# Patient Record
Sex: Male | Born: 1990 | Hispanic: No | Marital: Single | Smoking: Never smoker
Health system: Southern US, Community
[De-identification: ages and names within clinical notes are randomized; demographics above are authoritative.]

---

## 2015-06-07 ENCOUNTER — Encounter (HOSPITAL_COMMUNITY): Payer: Self-pay | Admitting: Emergency Medicine

## 2015-06-07 ENCOUNTER — Emergency Department (HOSPITAL_COMMUNITY)
Admission: EM | Admit: 2015-06-07 | Discharge: 2015-06-07 | Disposition: A | Discharge status: Home / Self Care | Payer: Self-pay | Attending: Emergency Medicine | Admitting: Emergency Medicine

## 2015-06-07 DIAGNOSIS — B349 Viral infection, unspecified: Secondary | ICD-10-CM | POA: Insufficient documentation

## 2015-06-07 NOTE — ED Notes (Signed)
Pt c/o body aches and headache onset yesterday afternoon. Pt denies nausea or vomiting. Pt denies exposure to others with illness.

## 2015-06-07 NOTE — ED Provider Notes (Signed)
CSN: [id number]     Arrival date & time 06/07/15  4098 History   First MD Initiated Contact with Patient 06/07/15 1005     Chief Complaint  Patient presents with  . Influenza  . Generalized Body Aches  . Headache   HPI   25 year old male presents today with complaints of generalized body aches, fever, sore throat. Patient reports symptoms started yesterday with diffuse body aches, dry nonproductive cough, and headache. He describes the headache as generalized with no focal neurological deficits, he denies neck stiffness, durations mental status. Patient denies any nausea, vomiting, chest pain, shortness of breath, abdominal pain, diarrhea, changes in urine color clarity or characteristics. Patient reports he is otherwise healthy, takes no daily medications, has not tried any medications prior to arrival. Has no headache right flank.   History reviewed. No pertinent past medical history. History reviewed. No pertinent past surgical history. No family history on file. Social History  Substance Use Topics  . Smoking status: Never Smoker   . Smokeless tobacco: None  . Alcohol Use: Yes    Review of Systems  All other systems reviewed and are negative.   Allergies  Review of patient's allergies indicates no known allergies.  Home Medications   Prior to Admission medications   Not on File   BP 130/70 mmHg  Pulse 67  Temp(Src) 98.7 F (37.1 C) (Oral)  Resp 18  Ht  (1.575 m)  Wt 53.269 kg  BMI 21.47 kg/m2  SpO2 98%   Physical Exam  Constitutional: He is oriented to person, place, and time. He appears well-developed and well-nourished.  HENT:  Head: Normocephalic and atraumatic.  Right Ear: Hearing and tympanic membrane normal.  Left Ear: Hearing and tympanic membrane normal.  Nose: Nose normal. No rhinorrhea.  Mouth/Throat: Oropharynx is clear and moist and mucous membranes are normal. No oropharyngeal exudate, posterior oropharyngeal edema, posterior oropharyngeal  erythema or tonsillar abscesses.  Eyes: Conjunctivae are normal. Pupils are equal, round, and reactive to light. Right eye exhibits no discharge. Left eye exhibits no discharge. No scleral icterus.  Neck: Normal range of motion. No JVD present. No tracheal deviation present.  Cardiovascular: Normal rate, regular rhythm, normal heart sounds and intact distal pulses.  Exam reveals no gallop and no friction rub.   No murmur heard. Pulmonary/Chest: Effort normal and breath sounds normal. No stridor. No respiratory distress. He has no wheezes. He has no rales. He exhibits no tenderness.  Musculoskeletal: Normal range of motion. He exhibits no edema.  Neurological: He is alert and oriented to person, place, and time. Coordination normal.  Skin: Skin is warm and dry. No rash noted. No erythema. No pallor.  Psychiatric: He has a normal mood and affect. His behavior is normal. Judgment and thought content normal.  Nursing note and vitals reviewed.   ED Course  Procedures (including critical care time) Labs Review Labs Reviewed - No data to display  Imaging Review No results found. I have personally reviewed and evaluated these images and lab results as part of my medical decision-making.   EKG Interpretation None      MDM   Final diagnoses:  Viral illness    Labs:  Imaging:  Consults:  Therapeutics:  Discharge Meds:   Assessment/Plan: 25 year old male presents with likely viral illness. This could most likely represent influenza, acute onset with extreme fatigue, sore throat, dry nonproductive cough. Patient is presently afebrile, nontoxic in no acute distress. He has clear lung sounds, oxygen and respiratory rate reassuring.  Patient is otherwise healthy male with no acute chronic medical conditions. He'll be discharged home with symptomatic care instructions, strict return precautions. Patient verbalized understanding and agreement for today's plan and had no further questions or  concerns at the time of discharge         Eyvonne Mechanic, PA-C 06/07/15 1213  Lyndal Pulley, MD 06/07/15 [phone]

## 2015-09-06 ENCOUNTER — Emergency Department (HOSPITAL_COMMUNITY)
Admission: EM | Admit: 2015-09-06 | Discharge: 2015-09-06 | Disposition: A | Discharge status: Home / Self Care | Payer: Self-pay | Attending: Emergency Medicine | Admitting: Emergency Medicine

## 2015-09-06 ENCOUNTER — Emergency Department (HOSPITAL_COMMUNITY): Payer: Self-pay

## 2015-09-06 ENCOUNTER — Encounter (HOSPITAL_COMMUNITY): Payer: Self-pay | Admitting: *Deleted

## 2015-09-06 DIAGNOSIS — S40811A Abrasion of right upper arm, initial encounter: Secondary | ICD-10-CM | POA: Insufficient documentation

## 2015-09-06 DIAGNOSIS — R0781 Pleurodynia: Secondary | ICD-10-CM

## 2015-09-06 DIAGNOSIS — Y9289 Other specified places as the place of occurrence of the external cause: Secondary | ICD-10-CM | POA: Insufficient documentation

## 2015-09-06 DIAGNOSIS — W010XXA Fall on same level from slipping, tripping and stumbling without subsequent striking against object, initial encounter: Secondary | ICD-10-CM | POA: Insufficient documentation

## 2015-09-06 DIAGNOSIS — Y9389 Activity, other specified: Secondary | ICD-10-CM | POA: Insufficient documentation

## 2015-09-06 DIAGNOSIS — Y998 Other external cause status: Secondary | ICD-10-CM | POA: Insufficient documentation

## 2015-09-06 MED ORDER — OXYCODONE HCL 5 MG PO TABS: 5.0000 mg | ORAL_TABLET | Freq: Four times a day (QID) | ORAL | Status: AC | PRN

## 2015-09-06 MED ORDER — OXYCODONE-ACETAMINOPHEN 5-325 MG PO TABS
1.0000 | ORAL_TABLET | Freq: Once | ORAL | Status: AC
  Administered 2015-09-06: 1 via ORAL
  Filled 2015-09-06: qty 1

## 2015-09-06 NOTE — ED Provider Notes (Signed)
CSN: [id number]     Arrival date & time 09/06/15  4098 History   First MD Initiated Contact with Patient 09/06/15 0703     Chief Complaint  Patient presents with  . Back Injury   (Consider location/radiation/quality/duration/timing/severity/associated sxs/prior Treatment) HPI 25 y.o. male  presents to the Emergency Department today complaining of right back pain s/p fall on Saturday. Pt states that he was lifting a trash can to put into a dumpster when he slipped and fell on his right side. Noted pain immediately. States pain is 10/10 and hurts with inspiration. Has not tried any OTC relief. No fevers. No CP/SOB/ABD pain. No headaches. No N/V. No fevers. No other symptoms noted.   History reviewed. No pertinent past medical history. History reviewed. No pertinent past surgical history. No family history on file. Social History  Substance Use Topics  . Smoking status: Never Smoker   . Smokeless tobacco: Never Used  . Alcohol Use: Yes    Review of Systems  Constitutional: Negative for fever.  Respiratory: Negative for shortness of breath.   Cardiovascular: Negative for chest pain.  Musculoskeletal: Negative for myalgias, back pain, neck pain and neck stiffness.   Allergies  Review of patient's allergies indicates no known allergies.  Home Medications   Prior to Admission medications   Not on File   BP 120/65 mmHg  Pulse 67  Temp(Src) 98.4 F (36.9 C) (Oral)  Resp 18  Wt 52.164 kg  SpO2 100%   Physical Exam  Constitutional: He is oriented to person, place, and time. He appears well-developed and well-nourished.  HENT:  Head: Normocephalic and atraumatic.  Eyes: EOM are normal. Pupils are equal, round, and reactive to light.  Neck: Normal range of motion. Neck supple.  Cardiovascular: Normal rate, regular rhythm, normal heart sounds and intact distal pulses.   No murmur heard. Pulmonary/Chest: Effort normal and breath sounds normal. No accessory muscle usage. No  respiratory distress. He has no decreased breath sounds. He has no wheezes. He has no rhonchi. He has no rales. He exhibits tenderness.  Abrasions noted on right axilla. No step off or deformities visualized or palpated. No ecchymosis.   Abdominal: Soft.  Musculoskeletal: Normal range of motion.  Neurological: He is alert and oriented to person, place, and time.  Skin: Skin is warm and dry.  Psychiatric: He has a normal mood and affect. His behavior is normal. Thought content normal.  Nursing note and vitals reviewed.  ED Course  Procedures (including critical care time) Labs Review Labs Reviewed - No data to display  Imaging Review Dg Chest 2 View  09/06/2015  CLINICAL DATA:  Right-sided chest pain from recent fall, initial encounter EXAM: CHEST  2 VIEW COMPARISON:  None. FINDINGS: The heart size and mediastinal contours are within normal limits. Both lungs are clear. The visualized skeletal structures are unremarkable. IMPRESSION: No active cardiopulmonary disease. Electronically Signed   By: Alcide Clever M.D.   On: 09/06/2015 08:23   I have personally reviewed and evaluated these images and lab results as part of my medical decision-making.   EKG Interpretation None      MDM  I have reviewed and evaluated the relevant imaging studies.  I have reviewed the relevant previous healthcare records. I obtained HPI from historian.  ED Course:  Assessment: Pt is a 24yM  who presents with right axilla pain s/p fall while lifting trash can. Fell with no head trauma or LOC. On exam, pt in NAD. Nontoxic/nonseptic appearing. VSS. Afebrile. Lungs  CTA. Heart RRR. Abdomen nontender soft. Abrasions noted on right axilla with no visible/palpable deformities or step offs. CXR negative for fracture. Given analgesia in ED. Plan is to DC Home with follow up to PCP/ Given Incentive Spirometer. At time of discharge, Patient is in no acute distress. Vital Signs are stable. Patient is able to ambulate. Patient  able to tolerate PO.    Disposition/Plan:  DC Home Additional Verbal discharge instructions given and discussed with patient.  Pt Instructed to f/u with PCP in the next week for evaluation and treatment of symptoms. Return precautions given Pt acknowledges and agrees with plan  Supervising Physician Leta BaptistEmily Roe Nguyen, MD   Final diagnoses:  Rib pain on right side     Audry Piliyler Adrieanna Boteler, PA-C 09/06/15 [id number]  Leta BaptistEmily Roe Nguyen, MD 09/11/15 [phone]

## 2015-09-06 NOTE — ED Notes (Signed)
Saturday he feel landing on the bottom of a trash can.  C/o right lower back pain
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
## 2018-06-08 ENCOUNTER — Other Ambulatory Visit: Payer: Self-pay

## 2018-06-08 ENCOUNTER — Emergency Department (HOSPITAL_COMMUNITY)
Admission: EM | Admit: 2018-06-08 | Discharge: 2018-06-08 | Disposition: A | Discharge status: Home / Self Care | Payer: No Typology Code available for payment source | Attending: Emergency Medicine | Admitting: Emergency Medicine

## 2018-06-08 ENCOUNTER — Encounter (HOSPITAL_COMMUNITY): Payer: Self-pay | Admitting: Emergency Medicine

## 2018-06-08 ENCOUNTER — Emergency Department (HOSPITAL_COMMUNITY): Payer: No Typology Code available for payment source

## 2018-06-08 DIAGNOSIS — Y939 Activity, unspecified: Secondary | ICD-10-CM | POA: Insufficient documentation

## 2018-06-08 DIAGNOSIS — M791 Myalgia, unspecified site: Secondary | ICD-10-CM | POA: Insufficient documentation

## 2018-06-08 DIAGNOSIS — M7918 Myalgia, other site: Secondary | ICD-10-CM

## 2018-06-08 DIAGNOSIS — Y998 Other external cause status: Secondary | ICD-10-CM | POA: Diagnosis not present

## 2018-06-08 DIAGNOSIS — R079 Chest pain, unspecified: Secondary | ICD-10-CM | POA: Diagnosis present

## 2018-06-08 DIAGNOSIS — Y92410 Unspecified street and highway as the place of occurrence of the external cause: Secondary | ICD-10-CM | POA: Diagnosis not present

## 2018-06-08 MED ORDER — NAPROXEN 250 MG PO TABS
500.0000 mg | ORAL_TABLET | Freq: Once | ORAL | Status: AC
  Administered 2018-06-08: 500 mg via ORAL
  Filled 2018-06-08: qty 2

## 2018-06-08 MED ORDER — NAPROXEN 500 MG PO TABS: 500.0000 mg | ORAL_TABLET | Freq: Two times a day (BID) | ORAL | 0 refills | Status: AC | PRN

## 2018-06-08 MED ORDER — METHOCARBAMOL 500 MG PO TABS: 500.0000 mg | ORAL_TABLET | Freq: Two times a day (BID) | ORAL | 0 refills | Status: AC | PRN

## 2018-06-08 NOTE — ED Notes (Signed)
No answer for vitals recheck x1 
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
## 2018-06-08 NOTE — Discharge Instructions (Signed)
Alternate ice and heat to areas of injury 3-4 times per day to limit inflammation and spasm.  Avoid strenuous activity and heavy lifting.  We recommend consistent use of naproxen in addition to Robaxin for muscle spasms.  Do not drive or drink alcohol after taking Robaxin as it may make you drowsy and impair your judgment.  We recommend follow-up with a primary care doctor to ensure resolution of symptoms.  Return to the ED for any new or concerning symptoms.  Alternar el hielo y el calor a las reas de lesin 3-4 veces al da para limitar la inflamacin y el espasmo.  Evite la actividad extenuante y el levantamiento pesado.  Recomendamos el uso constante de naproxeno adems de Robaxin para espasmos musculares.  No conduzca ni beba alcohol despus de tomar Robaxin, ya que puede causarle somnolencia y Audiological scientist su juicio.  Recomendamos el seguimiento con un mdico de atencin primaria para asegurar la resolucin de los sntomas.  Regrese al ED para cualquier sntoma nuevo o preocupante.
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
## 2023-01-19 ENCOUNTER — Encounter (HOSPITAL_BASED_OUTPATIENT_CLINIC_OR_DEPARTMENT_OTHER): Payer: Self-pay | Admitting: Emergency Medicine

## 2023-01-19 ENCOUNTER — Emergency Department (HOSPITAL_BASED_OUTPATIENT_CLINIC_OR_DEPARTMENT_OTHER)
Admission: EM | Admit: 2023-01-19 | Discharge: 2023-01-19 | Disposition: A | Discharge status: Home / Self Care | Payer: Self-pay | Attending: Emergency Medicine | Admitting: Emergency Medicine

## 2023-01-19 ENCOUNTER — Other Ambulatory Visit: Payer: Self-pay

## 2023-01-19 DIAGNOSIS — Y9389 Activity, other specified: Secondary | ICD-10-CM | POA: Insufficient documentation

## 2023-01-19 DIAGNOSIS — W458XXA Other foreign body or object entering through skin, initial encounter: Secondary | ICD-10-CM | POA: Insufficient documentation

## 2023-01-19 DIAGNOSIS — S0502XA Injury of conjunctiva and corneal abrasion without foreign body, left eye, initial encounter: Secondary | ICD-10-CM | POA: Insufficient documentation

## 2023-01-19 MED ORDER — CIPROFLOXACIN HCL 0.3 % OP SOLN: 2.0000 [drp] | Freq: Four times a day (QID) | OPHTHALMIC | 0 refills | Status: AC

## 2023-01-19 MED ORDER — FLUORESCEIN SODIUM 1 MG OP STRP
1.0000 | ORAL_STRIP | Freq: Once | OPHTHALMIC | Status: DC
  Filled 2023-01-19: qty 1

## 2023-01-19 MED ORDER — TETRACAINE HCL 0.5 % OP SOLN
2.0000 [drp] | Freq: Once | OPHTHALMIC | Status: DC
  Filled 2023-01-19: qty 4

## 2023-01-19 NOTE — Discharge Instructions (Signed)
Gracias por permitirnos ser parte de su cuidado hoy.  Tienes una abrasin en la crnea.  Por favor control con el oftalmlogo el lunes o martes de la prxima [place].  Utilice las gotas para los ojos segn lo prescrito.  Regrese al servicio de urgencias si sus sntomas empeoran repentinamente o si tiene alguna inquietud nueva.  Thank you for allowing [place] to be a part of your care today.  You have an abrasion to your cornea.  Please follow-up with the ophthalmologist on Monday or Tuesday of next week.  Use the eyedrops as prescribed.  Return to the ED if develop sudden worsening of her symptoms or if you have any new concerns.

## 2023-01-19 NOTE — ED Triage Notes (Signed)
Eye red and painful\ Was cutting deck for work and thinks a piece of deck went in left eye. Flush with water PTA with out relieve Happened today around 3pm

## 2023-01-19 NOTE — ED Provider Notes (Signed)
[place] EMERGENCY DEPARTMENT AT [facility] Provider Note   CSN: [id number] Arrival date & time: 01/19/23  1850     History  Chief Complaint  Patient presents with   Eye Problem    Peter Harper is a 32 y.o. male presents to the ED complaining of redness and pain to his left eye.  Patient was cutting a deck for work and believes a piece of the vinyl plank went into his eye.  Patient has been flushing the eye with water without relief.  He feels that there is still a foreign body in the eye.  Incident happened around 3 PM today.  He also feels that the eye has become more swollen and his vision slightly blurred while waiting for evaluation.         Home Medications Prior to Admission medications   Medication Sig Start Date End Date Taking? Authorizing Provider  methocarbamol (ROBAXIN) 500 MG tablet Take 1 tablet (500 mg total) by mouth every 12 (twelve) hours as needed for muscle spasms. 06/08/18   Antony Madura, PA-C  naproxen (NAPROSYN) 500 MG tablet Take 1 tablet (500 mg total) by mouth every 12 (twelve) hours as needed for mild pain or moderate pain. 06/08/18   Antony Madura, PA-C  oxyCODONE (ROXICODONE) 5 MG immediate release tablet Take 1 tablet (5 mg total) by mouth every 6 (six) hours as needed for severe pain. 09/06/15   Audry Pili, PA-C      Allergies    Patient has no known allergies.    Review of Systems   Review of Systems  Eyes:  Positive for pain, redness and visual disturbance (blurry). Negative for photophobia.    Physical Exam Updated Vital Signs BP 124/81 (BP Location: Right Arm)   Pulse 67   Temp 97.6 F (36.4 C)   Resp 17   SpO2 99%  Physical Exam Vitals and nursing note reviewed.  Constitutional:      General: He is awake. He is not in acute distress.    Appearance: Normal appearance. He is not ill-appearing or diaphoretic.  Eyes:     General: Lids are normal. Lids are everted, no foreign bodies appreciated. Vision grossly intact.         Left eye: No foreign body or discharge.     Intraocular pressure: Left eye pressure is 20 mmHg. Measurements were taken using a handheld tonometer.    Extraocular Movements: Extraocular movements intact.     Left eye: Normal extraocular motion.     Conjunctiva/sclera:     Left eye: Left conjunctiva is injected. No hemorrhage.    Pupils: Pupils are equal, round, and reactive to light.     Left eye: Corneal abrasion and fluorescein uptake present.   Cardiovascular:     Rate and Rhythm: Normal rate and regular rhythm.  Pulmonary:     Effort: Pulmonary effort is normal.  Neurological:     Mental Status: He is alert. Mental status is at baseline.  Psychiatric:        Mood and Affect: Mood normal.        Behavior: Behavior normal.     ED Results / Procedures / Treatments   Labs (all labs ordered are listed, but only abnormal results are displayed) Labs Reviewed - No data to display  EKG None  Radiology No results found.  Procedures Procedures    Medications Ordered in ED Medications  tetracaine (PONTOCAINE) 0.5 % ophthalmic solution 2 drop (has no administration in time range)  fluorescein ophthalmic strip 1 strip (has no administration in time range)    ED Course/ Medical Decision Making/ A&P                                 Medical Decision Making Risk Prescription drug management.   This patient presents to the ED with chief complaint(s) of eye irritation and injury. The complaint involves an extensive differential diagnosis and also carries with it a high risk of complications and morbidity.    The differential diagnosis includes corneal abrasion, foreign body in eye   Initial Assessment:   Exam significant for injected conjunctive/sclerae of left eye, no hemorrhage.  EOM intact.  Intraocular pressure normal.  No obvious foreign body.  Eye was flushed with normal saline.  Vision is grossly intact.  Visual acuity was checked.  Family reports patient has poor  vision and does not wear contacts or glasses.  Woods lamp exam revealed corneal abrasions.    Disposition:   Will treat with ciprofloxacin eye drops.  Advised patient to follow-up with ophthalmology early next week. Referral information provided.  The patient has been appropriately medically screened and/or stabilized in the ED. I have low suspicion for any other emergent medical condition which would require further screening, evaluation or treatment in the ED or require inpatient management. At time of discharge the patient is hemodynamically stable and in no acute distress. I have discussed work-up results and diagnosis with patient and answered all questions. Patient is agreeable with discharge plan. We discussed strict return precautions for returning to the emergency department and they verbalized understanding.           Final Clinical Impression(s) / ED Diagnoses Final diagnoses:  None    Rx / DC Orders ED Discharge Orders     None         Lenard Simmer, PA-C 01/19/23 2316    Terrilee Files, MD 01/20/23 1050

## 2023-01-19 NOTE — ED Notes (Signed)
Family member states pt has history of bad vision. Does not wear glasses or contacts.

## 2023-10-01 ENCOUNTER — Encounter (HOSPITAL_BASED_OUTPATIENT_CLINIC_OR_DEPARTMENT_OTHER): Payer: Self-pay | Admitting: Emergency Medicine

## 2024-03-31 ENCOUNTER — Encounter (HOSPITAL_BASED_OUTPATIENT_CLINIC_OR_DEPARTMENT_OTHER): Payer: Self-pay | Admitting: Emergency Medicine

## 2024-03-31 ENCOUNTER — Emergency Department (HOSPITAL_BASED_OUTPATIENT_CLINIC_OR_DEPARTMENT_OTHER): Payer: Self-pay | Admitting: Radiology

## 2024-03-31 ENCOUNTER — Other Ambulatory Visit: Payer: Self-pay

## 2024-03-31 DIAGNOSIS — R0789 Other chest pain: Secondary | ICD-10-CM | POA: Insufficient documentation

## 2024-03-31 DIAGNOSIS — R059 Cough, unspecified: Secondary | ICD-10-CM | POA: Insufficient documentation

## 2024-03-31 DIAGNOSIS — M549 Dorsalgia, unspecified: Secondary | ICD-10-CM | POA: Insufficient documentation

## 2024-03-31 LAB — CBC
HCT: 44.6 % (ref 39.0–52.0)
Hemoglobin: 15.6 g/dL (ref 13.0–17.0)
MCH: 31.7 pg (ref 26.0–34.0)
MCHC: 35 g/dL (ref 30.0–36.0)
MCV: 90.7 fL (ref 80.0–100.0)
Platelets: 257 K/uL (ref 150–400)
RBC: 4.92 MIL/uL (ref 4.22–5.81)
RDW: 12.7 % (ref 11.5–15.5)
WBC: 9.2 K/uL (ref 4.0–10.5)
nRBC: 0 % (ref 0.0–0.2)

## 2024-03-31 LAB — TROPONIN T, HIGH SENSITIVITY: Troponin T High Sensitivity: <15 ng/L (ref 0–19)

## 2024-03-31 LAB — BASIC METABOLIC PANEL WITH GFR
Anion gap: 11 (ref 5–15)
BUN: 14 mg/dL (ref 6–20)
CO2: 24 mmol/L (ref 22–32)
Calcium: 9.1 mg/dL (ref 8.9–10.3)
Chloride: 104 mmol/L (ref 98–111)
Creatinine, Ser: 0.71 mg/dL (ref 0.61–1.24)
GFR, Estimated: >60 mL/min
Glucose, Bld: 117 mg/dL — ABNORMAL HIGH (ref 70–99)
Potassium: 3.9 mmol/L (ref 3.5–5.1)
Sodium: 139 mmol/L (ref 135–145)

## 2024-03-31 NOTE — ED Triage Notes (Signed)
 Pt via pov from home with chest and back pain possibly following an inhalation of chlorine bleach while working. He states he feels sob, as well as having pain in his chest and back. Pt a&o, some coughing in triage.

## 2024-04-01 ENCOUNTER — Emergency Department (HOSPITAL_BASED_OUTPATIENT_CLINIC_OR_DEPARTMENT_OTHER)
Admission: EM | Admit: 2024-04-01 | Discharge: 2024-04-01 | Disposition: A | Discharge status: Home / Self Care | Payer: Self-pay | Attending: Emergency Medicine | Admitting: Emergency Medicine

## 2024-04-01 DIAGNOSIS — R0789 Other chest pain: Secondary | ICD-10-CM

## 2024-04-01 DIAGNOSIS — Z77098 Contact with and (suspected) exposure to other hazardous, chiefly nonmedicinal, chemicals: Secondary | ICD-10-CM

## 2024-04-01 LAB — RESP PANEL BY RT-PCR (RSV, FLU A&B, COVID)  RVPGX2
Influenza A by PCR: NEGATIVE
Influenza B by PCR: NEGATIVE
Resp Syncytial Virus by PCR: NEGATIVE
SARS Coronavirus 2 by RT PCR: NEGATIVE

## 2024-04-01 NOTE — ED Provider Notes (Signed)
 " [place] EMERGENCY DEPARTMENT AT [facility] Provider Note   CSN: [id number] Arrival date & time: 03/31/24  2217     Patient presents with: Chest Pain   Raghav Verrilli is a 33 y.o. male.   Patient is a 33 year old male presenting with complaints of chest discomfort and coughing.  He was working this evening with bleach.  He was wearing a mask, but believes he still inhaled some.  He began coughing and coughed to the point that his chest and back began hurting.  The cough has improved somewhat at the time of my evaluation.  He denies any fevers or chills.  Cough was nonproductive.       Prior to Admission medications  Medication Sig Start Date End Date Taking? Authorizing Provider  methocarbamol  (ROBAXIN ) 500 MG tablet Take 1 tablet (500 mg total) by mouth every 12 (twelve) hours as needed for muscle spasms. 06/08/18   Keith Sor, PA-C  naproxen  (NAPROSYN ) 500 MG tablet Take 1 tablet (500 mg total) by mouth every 12 (twelve) hours as needed for mild pain or moderate pain. 06/08/18   Keith Sor, PA-C  oxyCODONE  (ROXICODONE ) 5 MG immediate release tablet Take 1 tablet (5 mg total) by mouth every 6 (six) hours as needed for severe pain. 09/06/15   Olympia Gee, PA-C    Allergies: Patient has no known allergies.    Review of Systems  All other systems reviewed and are negative.   Updated Vital Signs BP 134/76 (BP Location: Right Arm)   Pulse 81   Temp 97.9 F (36.6 C) (Oral)   Resp 18   Ht 4' 3.18 (1.3 m)   Wt 61.2 kg   SpO2 94%   BMI 36.23 kg/m   Physical Exam Vitals and nursing note reviewed.  Constitutional:      General: He is not in acute distress.    Appearance: He is well-developed. He is not diaphoretic.  HENT:     Head: Normocephalic and atraumatic.  Cardiovascular:     Rate and Rhythm: Normal rate and regular rhythm.     Heart sounds: No murmur heard.    No friction rub.  Pulmonary:     Effort: Pulmonary effort is normal. No respiratory  distress.     Breath sounds: Normal breath sounds. No wheezing or rales.  Abdominal:     General: Bowel sounds are normal. There is no distension.     Palpations: Abdomen is soft.     Tenderness: There is no abdominal tenderness.  Musculoskeletal:        General: Normal range of motion.     Cervical back: Normal range of motion and neck supple.  Skin:    General: Skin is warm and dry.  Neurological:     Mental Status: He is alert and oriented to person, place, and time.     Coordination: Coordination normal.     (all labs ordered are listed, but only abnormal results are displayed) Labs Reviewed  BASIC METABOLIC PANEL WITH GFR - Abnormal; Notable for the following components:      Result Value   Glucose, Bld 117 (*)    All other components within normal limits  RESP PANEL BY RT-PCR (RSV, FLU A&B, COVID)  RVPGX2  CBC  TROPONIN T, HIGH SENSITIVITY  TROPONIN T, HIGH SENSITIVITY    EKG: EKG Interpretation Date/Time:  Monday March 31 2024 22:44:33 EST Ventricular Rate:  88 PR Interval:  144 QRS Duration:  88 QT Interval:  354 QTC Calculation:  428 R Axis:   98  Text Interpretation: Normal sinus rhythm Rightward axis Borderline ECG No previous ECGs available Confirmed by Geroldine Berg ([id number]) on 04/01/2024 2:39:12 AM  Radiology: DG Chest 2 View Result Date: 03/31/2024 CLINICAL DATA:  Chest pain EXAM: CHEST - 2 VIEW COMPARISON:  07/07/2015 FINDINGS: The heart size and mediastinal contours are within normal limits. Both lungs are clear. The visualized skeletal structures are unremarkable. IMPRESSION: No active cardiopulmonary disease. Electronically Signed   By: Luke Bun M.D.   On: 03/31/2024 23:02     Procedures   Medications Ordered in the ED - No data to display                                  Medical Decision Making Amount and/or Complexity of Data Reviewed Labs: ordered. Radiology: ordered.   Patient is a 33 year old male presenting with complaints of  chest pain and cough as described in the HPI.  This began while working with bleach.  Patient arrives here with stable vital signs and is afebrile.  There is no hypoxia.  Laboratory studies obtained in triage including CBC, basic metabolic panel, and troponin, all of which are unremarkable.  Respiratory panel is negative for COVID/flu/RSV.  Chest x-ray shows no acute process.  Patient presenting with coughing and chest discomfort after inhaling bleach.  There is nothing to indicate a cardiac or pulmonary etiology.  He seems to be feeling better without intervention and will be discharged with as needed return.     Final diagnoses:  None    ED Discharge Orders     None          Geroldine Berg, MD 04/01/24 [phone]  "

## 2024-04-01 NOTE — Discharge Instructions (Signed)
 Return to the emergency department if you develop any new and/or concerning issues.
